# Patient Record
Sex: Female | Born: 1935 | Race: Black or African American | Hispanic: No | Smoking: Never smoker
Health system: Southern US, Community
[De-identification: ages and names within clinical notes are randomized; demographics above are authoritative.]

## PROBLEM LIST (undated history)

## (undated) DIAGNOSIS — I10 Essential (primary) hypertension: Secondary | ICD-10-CM

---

## 2020-10-27 ENCOUNTER — Other Ambulatory Visit: Payer: Self-pay

## 2020-10-27 ENCOUNTER — Emergency Department (HOSPITAL_COMMUNITY): Payer: Medicare Other

## 2020-10-27 ENCOUNTER — Emergency Department (HOSPITAL_COMMUNITY)
Admission: EM | Admit: 2020-10-27 | Discharge: 2020-10-27 | Disposition: A | Discharge status: Home / Self Care | Payer: Medicare Other | Attending: Emergency Medicine | Admitting: Emergency Medicine

## 2020-10-27 DIAGNOSIS — D649 Anemia, unspecified: Secondary | ICD-10-CM

## 2020-10-27 DIAGNOSIS — M1611 Unilateral primary osteoarthritis, right hip: Secondary | ICD-10-CM | POA: Insufficient documentation

## 2020-10-27 DIAGNOSIS — E039 Hypothyroidism, unspecified: Secondary | ICD-10-CM | POA: Diagnosis not present

## 2020-10-27 DIAGNOSIS — Z79899 Other long term (current) drug therapy: Secondary | ICD-10-CM | POA: Insufficient documentation

## 2020-10-27 DIAGNOSIS — W19XXXA Unspecified fall, initial encounter: Secondary | ICD-10-CM | POA: Insufficient documentation

## 2020-10-27 DIAGNOSIS — M25561 Pain in right knee: Secondary | ICD-10-CM | POA: Diagnosis not present

## 2020-10-27 DIAGNOSIS — I10 Essential (primary) hypertension: Secondary | ICD-10-CM | POA: Insufficient documentation

## 2020-10-27 DIAGNOSIS — M545 Low back pain, unspecified: Secondary | ICD-10-CM | POA: Insufficient documentation

## 2020-10-27 DIAGNOSIS — M25562 Pain in left knee: Secondary | ICD-10-CM | POA: Insufficient documentation

## 2020-10-27 DIAGNOSIS — S79911A Unspecified injury of right hip, initial encounter: Secondary | ICD-10-CM | POA: Diagnosis present

## 2020-10-27 DIAGNOSIS — F0390 Unspecified dementia without behavioral disturbance: Secondary | ICD-10-CM | POA: Diagnosis not present

## 2020-10-27 DIAGNOSIS — R531 Weakness: Secondary | ICD-10-CM | POA: Diagnosis not present

## 2020-10-27 DIAGNOSIS — E860 Dehydration: Secondary | ICD-10-CM

## 2020-10-27 DIAGNOSIS — S72114A Nondisplaced fracture of greater trochanter of right femur, initial encounter for closed fracture: Secondary | ICD-10-CM

## 2020-10-27 DIAGNOSIS — G8929 Other chronic pain: Secondary | ICD-10-CM

## 2020-10-27 LAB — COMPREHENSIVE METABOLIC PANEL
ALT: 18 U/L (ref 0–44)
AST: 23 U/L (ref 15–41)
Albumin: 3.1 g/dL — ABNORMAL LOW (ref 3.5–5.0)
Alkaline Phosphatase: 47 U/L (ref 38–126)
Anion gap: 9 (ref 5–15)
BUN: 18 mg/dL (ref 8–23)
CO2: 25 mmol/L (ref 22–32)
Calcium: 7.9 mg/dL — ABNORMAL LOW (ref 8.9–10.3)
Chloride: 108 mmol/L (ref 98–111)
Creatinine, Ser: 1.23 mg/dL — ABNORMAL HIGH (ref 0.44–1.00)
GFR, Estimated: 42 mL/min — ABNORMAL LOW (ref >60)
Glucose, Bld: 116 mg/dL — ABNORMAL HIGH (ref 70–99)
Potassium: 3.2 mmol/L — ABNORMAL LOW (ref 3.5–5.1)
Sodium: 142 mmol/L (ref 135–145)
Total Bilirubin: 0.4 mg/dL (ref 0.3–1.2)
Total Protein: 7 g/dL (ref 6.5–8.1)

## 2020-10-27 LAB — CBC WITH DIFFERENTIAL/PLATELET
Abs Immature Granulocytes: 0.03 10*3/uL (ref 0.00–0.07)
Basophils Absolute: 0 10*3/uL (ref 0.0–0.1)
Basophils Relative: 0 %
Eosinophils Absolute: 0 10*3/uL (ref 0.0–0.5)
Eosinophils Relative: 0 %
HCT: 28.5 % — ABNORMAL LOW (ref 36.0–46.0)
Hemoglobin: 8.2 g/dL — ABNORMAL LOW (ref 12.0–15.0)
Immature Granulocytes: 0 %
Lymphocytes Relative: 18 %
Lymphs Abs: 1.4 10*3/uL (ref 0.7–4.0)
MCH: 23.7 pg — ABNORMAL LOW (ref 26.0–34.0)
MCHC: 28.8 g/dL — ABNORMAL LOW (ref 30.0–36.0)
MCV: 82.4 fL (ref 80.0–100.0)
Monocytes Absolute: 1 10*3/uL (ref 0.1–1.0)
Monocytes Relative: 12 %
Neutro Abs: 5.4 10*3/uL (ref 1.7–7.7)
Neutrophils Relative %: 70 %
Platelets: 221 10*3/uL (ref 150–400)
RBC: 3.46 MIL/uL — ABNORMAL LOW (ref 3.87–5.11)
RDW: 16.4 % — ABNORMAL HIGH (ref 11.5–15.5)
WBC: 7.8 10*3/uL (ref 4.0–10.5)
nRBC: 0 % (ref 0.0–0.2)

## 2020-10-27 LAB — URINALYSIS, ROUTINE W REFLEX MICROSCOPIC
Bilirubin Urine: NEGATIVE
Glucose, UA: NEGATIVE mg/dL
Ketones, ur: NEGATIVE mg/dL
Nitrite: NEGATIVE
Protein, ur: NEGATIVE mg/dL
Specific Gravity, Urine: 1.014 (ref 1.005–1.030)
pH: 7 (ref 5.0–8.0)

## 2020-10-27 LAB — POC OCCULT BLOOD, ED: Fecal Occult Bld: NEGATIVE

## 2020-10-27 MED ORDER — DICLOFENAC SODIUM 1 % EX GEL
2.0000 g | Freq: Once | CUTANEOUS | Status: AC
  Administered 2020-10-27: 2 g via TOPICAL
  Filled 2020-10-27: qty 100

## 2020-10-27 MED ORDER — ACETAMINOPHEN 325 MG PO TABS
650.0000 mg | ORAL_TABLET | Freq: Once | ORAL | Status: AC
  Administered 2020-10-27: 650 mg via ORAL
  Filled 2020-10-27: qty 2

## 2020-10-27 MED ORDER — DOCUSATE SODIUM 100 MG PO CAPS: 100.0000 mg | ORAL_CAPSULE | Freq: Two times a day (BID) | ORAL | 0 refills | Status: AC | PRN

## 2020-10-27 MED ORDER — FERROUS SULFATE 325 (65 FE) MG PO TABS: 325.0000 mg | ORAL_TABLET | Freq: Every day | ORAL | 0 refills | Status: AC

## 2020-10-27 MED ORDER — HYDROCODONE-ACETAMINOPHEN 5-325 MG PO TABS: 1.0000 | ORAL_TABLET | ORAL | 0 refills | Status: AC | PRN

## 2020-10-27 MED ORDER — SODIUM CHLORIDE 0.9 % IV BOLUS
1000.0000 mL | Freq: Once | INTRAVENOUS | Status: AC
  Administered 2020-10-27: 1000 mL via INTRAVENOUS

## 2020-10-27 MED ORDER — FENTANYL CITRATE (PF) 100 MCG/2ML IJ SOLN
25.0000 ug | Freq: Once | INTRAMUSCULAR | Status: AC
  Administered 2020-10-27: 25 ug via INTRAVENOUS
  Filled 2020-10-27: qty 2

## 2020-10-27 NOTE — ED Notes (Addendum)
Unable to obtain blood samples at this time; Provider notified via secure chat.

## 2020-10-27 NOTE — ED Notes (Signed)
Pt placed on purewick 

## 2020-10-27 NOTE — ED Triage Notes (Signed)
Per family and pt; pt fell 2 days ago and has had acute on chronic pain in right hip and knee; pt states that she has missed her last visit for cortisone injections that help with pain. Pt had had increased weakness with increased urine frequency and concentration; per family color of urine is darker.

## 2020-10-27 NOTE — ED Provider Notes (Signed)
[facility]-EMERGENCY DEPT Provider Note   CSN: [id number] Arrival date & time: 10/27/20  1105     History Chief Complaint  Patient presents with  . Weakness   HPI:  Pamela Mora is a 87 y.o. female.  Complaining of bilateral knee pain and low back pain has been ongoing for multiple weeks now.  She is a poor historian given history of dementia although unclear what her mental status baseline is.  States that she has been having difficulty with moving around at home.  She usually uses a 4 wheeled rolling walker for ambulation and states that she has been having a hard time using it to move around her home.  She lives with a friend but usually performs most ADLs on her own.  Upon chart review, she was recently seen by [facility] orthopedics for complaints of bilateral knee pain and low back pain and she intermittently receives any injections for her pain.  Chart review also shows that she has chronic, but stable, weakness bilaterally which is similar to her complaints today.  X-ray imaging during her visit is demonstrative of degenerative changes in the lumbar spine along with L5 compression deformity.  Imaging of her knees post bilateral knee osteoarthritis.  She last received knee injections in August 04, 2020.  She has scheduled follow-up in 1 week with Dr. Bayard Hugger, [facility] orthopedic surgery, for review of DEXA scan and consideration of bilateral knee injections.     Past Medical History:  History of hyperlipidemia, hypertension, hypothyroidism, GERD, anxiety, depression, dementia, iron deficiency anemia  History of closed compression fracture of L5 lumbar vertebrae and chronic pain of both knees.   Medications: Lisinopril 10 mg daily Simvastatin 40 mg daily Levothyroxine 88 mcg daily Sertraline 100 mg daily Risperidone 1.5 mg twice daily Aricept 10 mg daily Pepcid 20 mg Ferrous sulfate 325 mg daily Meloxicam 50 mg daily   Patient  Active Problem List   Diagnosis Date Noted  . Hypothyroidism 10/27/2020  . Hypertension 10/27/2020      OB History   No obstetric history on file.     No family history on file.     Home Medications Prior to Admission medications   Not on File    Allergies    Patient has no known allergies.  Review of Systems   Review of Systems  Constitutional: Negative.   HENT: Negative.   Eyes: Negative.   Respiratory: Negative.   Cardiovascular: Negative.   Gastrointestinal: Negative.   Musculoskeletal: Positive for arthralgias and back pain. Negative for gait problem, joint swelling, myalgias, neck pain and neck stiffness.  Neurological: Negative.   Psychiatric/Behavioral: Negative for agitation and confusion. The patient is not nervous/anxious.     Physical Exam Updated Vital Signs BP (!) 181/63 (BP Location: Right Arm)   Pulse 87   Temp 99.3 F (37.4 C) (Oral)   Resp 18   Ht 5\' 3"  (1.6 m)   Wt 70.3 kg   SpO2 100%   BMI 27.46 kg/m   Physical Exam Constitutional:      Appearance: Normal appearance. She is not ill-appearing.  HENT:     Head: Normocephalic and atraumatic.     Mouth/Throat:     Mouth: Mucous membranes are dry.     Pharynx: Oropharynx is clear. No oropharyngeal exudate.  Eyes:     Extraocular Movements: Extraocular movements intact.     Conjunctiva/sclera: Conjunctivae normal.     Pupils: Pupils are equal, round, and reactive  to light.  Cardiovascular:     Rate and Rhythm: Normal rate and regular rhythm.     Pulses: Normal pulses.     Heart sounds: Normal heart sounds. No murmur heard. No friction rub. No gallop.   Pulmonary:     Effort: Pulmonary effort is normal.     Breath sounds: Normal breath sounds. No wheezing, rhonchi or rales.  Abdominal:     General: Bowel sounds are normal.     Palpations: Abdomen is soft.     Tenderness: There is no abdominal tenderness.  Musculoskeletal:        General: No swelling.     Cervical back: Normal  range of motion.     Comments: Tenderness and impaired ROM of bilateral knee joints. Pinpoint TTP in lower back around area of L4/L5.   Skin:    General: Skin is warm and dry.  Neurological:     General: No focal deficit present.     Mental Status: She is alert. Mental status is at baseline.  Psychiatric:        Mood and Affect: Mood normal.        Behavior: Behavior normal.     ED Results / Procedures / Treatments   Labs (all labs ordered are listed, but only abnormal results are displayed) Labs Reviewed  COMPREHENSIVE METABOLIC PANEL    EKG None  Radiology No results found.  Procedures None  Medications Ordered in ED Medications  diclofenac Sodium (VOLTAREN) 1 % topical gel 2 g (has no administration in time range)  acetaminophen (TYLENOL) tablet 650 mg (650 mg Oral Given 10/27/20 1236)    ED Course  I have reviewed the triage vital signs and the nursing notes.  Pertinent labs & imaging results that were available during my care of the patient were reviewed by me and considered in my medical decision making (see chart for details).    MDM Rules/Calculators/A&P                          Patient here for chronic bilateral knee pain likely from her osteoarthritis and low back pain likely from L5 compression fracture both of which were seen on imaging during visit with [facility] orthopedic surgery about a week ago.  Given chronic pain, will try to treat with Tylenol and Voltaren gel at this time.  Will obtain CMP to ensure no electrolyte abnormalities or worsening kidney function given NSAID therapy for pain.  She has scheduled follow-up with [facility] orthopedic surgery in 1 week for bilateral knee injections and review of DEXA scan so we will avoid administering injections at this time.  Will discharge with Voltaren gel to apply as needed 3 times daily for knee pain and back pain.   Awaiting CMP results at this time.  Dr. Jeraldine Loots will follow up with  results and with dispo planning. Final Clinical Impression(s) / ED Diagnoses Final diagnoses:  None    Rx / DC Orders ED Discharge Orders    None       Merrilyn Puma, MD 10/27/20 1320    Gerhard Munch, MD 10/27/20 1541    Gerhard Munch, MD 10/27/20 1726

## 2020-10-27 NOTE — ED Provider Notes (Signed)
Pt signed out by Dr. Jeraldine Loots pending labs and xrays.  Pt's hemoglobin is 8.2.  She had labs by her new pcp on 3/21.  hgb then was 9.0.  Pt is supposed to be on iron, but her family took her off because it was turning her stool black and causing constipation.  Stool guaiac negative.  Xrays neg, but pt still having pain in her right hip.  Therefore, a CT was done of the hip.      IMPRESSION:  Nondisplaced fracture at the posterolateral corner of the greater  trochanter as described above    Mild overlying soft tissue swelling    Moderate osteoarthritis   Pt is able to ambulate with a walker after fentanyl.  [name] has a wheelchair and walker for pt at home.    Pt is d/c home with lortab.  They know to only give that to her if she really needs it.  Otherwise, use tylenol.  She is also told to take the iron pills and is prescribed colace.  Return if worse.   F/u with pcp and with ortho.   Jacalyn Lefevre, MD 10/27/20 2139

## 2020-11-02 ENCOUNTER — Emergency Department (HOSPITAL_COMMUNITY): Payer: Medicare Other

## 2020-11-02 ENCOUNTER — Encounter (HOSPITAL_COMMUNITY): Payer: Self-pay

## 2020-11-02 ENCOUNTER — Other Ambulatory Visit: Payer: Self-pay

## 2020-11-02 ENCOUNTER — Emergency Department (HOSPITAL_COMMUNITY)
Admission: EM | Admit: 2020-11-02 | Discharge: 2020-11-03 | Disposition: A | Discharge status: Home / Self Care | Payer: Medicare Other | Attending: Emergency Medicine | Admitting: Emergency Medicine

## 2020-11-02 DIAGNOSIS — F0390 Unspecified dementia without behavioral disturbance: Secondary | ICD-10-CM | POA: Insufficient documentation

## 2020-11-02 DIAGNOSIS — Z20822 Contact with and (suspected) exposure to covid-19: Secondary | ICD-10-CM | POA: Diagnosis not present

## 2020-11-02 DIAGNOSIS — S0990XA Unspecified injury of head, initial encounter: Secondary | ICD-10-CM | POA: Diagnosis present

## 2020-11-02 DIAGNOSIS — S3991XA Unspecified injury of abdomen, initial encounter: Secondary | ICD-10-CM | POA: Diagnosis not present

## 2020-11-02 DIAGNOSIS — J939 Pneumothorax, unspecified: Secondary | ICD-10-CM | POA: Insufficient documentation

## 2020-11-02 DIAGNOSIS — R410 Disorientation, unspecified: Secondary | ICD-10-CM | POA: Diagnosis not present

## 2020-11-02 DIAGNOSIS — S299XXA Unspecified injury of thorax, initial encounter: Secondary | ICD-10-CM | POA: Insufficient documentation

## 2020-11-02 DIAGNOSIS — Z741 Need for assistance with personal care: Secondary | ICD-10-CM | POA: Diagnosis not present

## 2020-11-02 DIAGNOSIS — Z79899 Other long term (current) drug therapy: Secondary | ICD-10-CM | POA: Insufficient documentation

## 2020-11-02 DIAGNOSIS — S0083XA Contusion of other part of head, initial encounter: Secondary | ICD-10-CM | POA: Diagnosis not present

## 2020-11-02 DIAGNOSIS — I10 Essential (primary) hypertension: Secondary | ICD-10-CM | POA: Diagnosis not present

## 2020-11-02 DIAGNOSIS — W19XXXA Unspecified fall, initial encounter: Secondary | ICD-10-CM | POA: Diagnosis not present

## 2020-11-02 DIAGNOSIS — E039 Hypothyroidism, unspecified: Secondary | ICD-10-CM | POA: Insufficient documentation

## 2020-11-02 HISTORY — DX: Essential (primary) hypertension: I10

## 2020-11-02 LAB — CBC WITH DIFFERENTIAL/PLATELET
Abs Immature Granulocytes: 0.01 10*3/uL (ref 0.00–0.07)
Basophils Absolute: 0 10*3/uL (ref 0.0–0.1)
Basophils Relative: 0 %
Eosinophils Absolute: 0.1 10*3/uL (ref 0.0–0.5)
Eosinophils Relative: 1 %
HCT: 32 % — ABNORMAL LOW (ref 36.0–46.0)
Hemoglobin: 9.1 g/dL — ABNORMAL LOW (ref 12.0–15.0)
Immature Granulocytes: 0 %
Lymphocytes Relative: 14 %
Lymphs Abs: 1.1 10*3/uL (ref 0.7–4.0)
MCH: 23.9 pg — ABNORMAL LOW (ref 26.0–34.0)
MCHC: 28.4 g/dL — ABNORMAL LOW (ref 30.0–36.0)
MCV: 84 fL (ref 80.0–100.0)
Monocytes Absolute: 0.6 10*3/uL (ref 0.1–1.0)
Monocytes Relative: 8 %
Neutro Abs: 5.6 10*3/uL (ref 1.7–7.7)
Neutrophils Relative %: 77 %
Platelets: 253 10*3/uL (ref 150–400)
RBC: 3.81 MIL/uL — ABNORMAL LOW (ref 3.87–5.11)
RDW: 16.4 % — ABNORMAL HIGH (ref 11.5–15.5)
WBC: 7.4 10*3/uL (ref 4.0–10.5)
nRBC: 0 % (ref 0.0–0.2)

## 2020-11-02 LAB — BASIC METABOLIC PANEL
Anion gap: 9 (ref 5–15)
BUN: 17 mg/dL (ref 8–23)
CO2: 23 mmol/L (ref 22–32)
Calcium: 7.8 mg/dL — ABNORMAL LOW (ref 8.9–10.3)
Chloride: 107 mmol/L (ref 98–111)
Creatinine, Ser: 1.1 mg/dL — ABNORMAL HIGH (ref 0.44–1.00)
GFR, Estimated: 48 mL/min — ABNORMAL LOW (ref >60)
Glucose, Bld: 101 mg/dL — ABNORMAL HIGH (ref 70–99)
Potassium: 3.4 mmol/L — ABNORMAL LOW (ref 3.5–5.1)
Sodium: 139 mmol/L (ref 135–145)

## 2020-11-02 LAB — RESP PANEL BY RT-PCR (FLU A&B, COVID) ARPGX2
Influenza A by PCR: NEGATIVE
Influenza B by PCR: NEGATIVE
SARS Coronavirus 2 by RT PCR: NEGATIVE

## 2020-11-02 LAB — CBG MONITORING, ED: Glucose-Capillary: 119 mg/dL — ABNORMAL HIGH (ref 70–99)

## 2020-11-02 MED ORDER — RISPERIDONE 0.5 MG PO TABS
0.5000 mg | ORAL_TABLET | Freq: Every day | ORAL | Status: DC
  Administered 2020-11-03: 0.5 mg via ORAL
  Filled 2020-11-02: qty 1

## 2020-11-02 MED ORDER — IOHEXOL 300 MG/ML  SOLN
100.0000 mL | Freq: Once | INTRAMUSCULAR | Status: AC | PRN
  Administered 2020-11-02: 100 mL via INTRAVENOUS

## 2020-11-02 MED ORDER — LISINOPRIL 10 MG PO TABS
10.0000 mg | ORAL_TABLET | Freq: Once | ORAL | Status: AC
  Administered 2020-11-03: 10 mg via ORAL
  Filled 2020-11-02: qty 1

## 2020-11-02 MED ORDER — LEVOTHYROXINE SODIUM 88 MCG PO TABS
88.0000 ug | ORAL_TABLET | Freq: Every morning | ORAL | Status: DC
  Filled 2020-11-02: qty 1

## 2020-11-02 MED ORDER — ACETAMINOPHEN 500 MG PO TABS
1000.0000 mg | ORAL_TABLET | Freq: Once | ORAL | Status: AC
  Administered 2020-11-02: 1000 mg via ORAL
  Filled 2020-11-02: qty 2

## 2020-11-02 MED ORDER — FAMOTIDINE 20 MG PO TABS
20.0000 mg | ORAL_TABLET | Freq: Two times a day (BID) | ORAL | Status: DC
  Administered 2020-11-03 (×2): 20 mg via ORAL
  Filled 2020-11-02 (×2): qty 1

## 2020-11-02 MED ORDER — SERTRALINE HCL 50 MG PO TABS
100.0000 mg | ORAL_TABLET | Freq: Every day | ORAL | Status: DC
  Administered 2020-11-03: 100 mg via ORAL
  Filled 2020-11-02: qty 2

## 2020-11-02 MED ORDER — FERROUS SULFATE 325 (65 FE) MG PO TABS
325.0000 mg | ORAL_TABLET | Freq: Every day | ORAL | Status: DC
  Administered 2020-11-02 – 2020-11-03 (×2): 325 mg via ORAL
  Filled 2020-11-02 (×2): qty 1

## 2020-11-02 MED ORDER — LISINOPRIL 10 MG PO TABS
10.0000 mg | ORAL_TABLET | Freq: Every day | ORAL | Status: DC
  Administered 2020-11-03: 10 mg via ORAL
  Filled 2020-11-02: qty 1

## 2020-11-02 NOTE — ED Notes (Signed)
Pt hypertensive, NAD noted.  MD made aware, MD resumed home meds which includes lisinopril.  Will administer.

## 2020-11-02 NOTE — ED Notes (Addendum)
Pt stood up beside bed and shuffled her feet twice and was fully relying on 2 techs for support. Pt stats her legs were very weak and wobbly.

## 2020-11-02 NOTE — ED Notes (Addendum)
Pt given graham crackers, applesauce, and sprite per request.  No diet or npo order in place, pt states she has not had anything to eat today and has been here since 8am.

## 2020-11-02 NOTE — Evaluation (Signed)
Physical Therapy Evaluation Patient Details Name: Pamela Mora MRN: [id number] DOB: Apr 12, 1929 Today's Date: 11/02/2020   History of Present Illness  87 yo F presents due to fall a couple days ago. PMH: HTN  Clinical Impression  Pt admitted with above diagnosis. Pt oriented to name only, chart review from previous admission states pt lives with friend, independent using 4 wheeled walker for ambulation and ADLs. Pt states she uses SPC and denies falls ever, though pt is in hospital due to recent fall. Pt denies pain, requiring mod A to sit up at EOB for trunk uprighting and min A to lift BLE back into bed. Pt reports dizziness upon sitting EOB, BP noted to be 203/74, returned to supine without improvement in symptoms, found to be 205/59mmHg- nurse tech in room verbalized she will tell RN. Transfers and ambulation not attempted due to BP not in therapeutic range and pt symptomatic. Pt will requires 24 hr assist at home, unsure if that is baseline or if family is available to assist; recommending SNF and will continue to assess DME needs. Pt currently with functional limitations due to the deficits listed below (see PT Problem List). Pt will benefit from skilled PT to increase their independence and safety with mobility to allow discharge to the venue listed below.       Follow Up Recommendations SNF    Equipment Recommendations  Other (comment) (TBD pending home equipment)    Recommendations for Other Services       Precautions / Restrictions        Mobility  Bed Mobility Overal bed mobility: Needs Assistance Bed Mobility: Supine to Sit;Sit to Supine  Supine to sit: Mod assist Sit to supine: Min assist   General bed mobility comments: mod A to upright trunk into sitting and pt able to mobilize BLE over to EOB, min A to lift BLE back into bed, increased time and multimodal cues    Transfers  General transfer comment: not attempted, pt reports dizziness upon sitting and BP  203/90mmHg  Ambulation/Gait  General Gait Details: not attempted, pt reports dizziness upon sitting and BP 203/31mmHg  Stairs            Wheelchair Mobility    Modified Rankin (Stroke Patients Only)       Balance Overall balance assessment: Needs assistance;History of Falls Sitting-balance support: Bilateral upper extremity supported Sitting balance-Leahy Scale: Fair Sitting balance - Comments: seated EOB          Pertinent Vitals/Pain Pain Assessment: No/denies pain    Home Living Family/patient expects to be discharged to:: Unsure Living Arrangements: Other relatives  Additional Comments: Per chart review, previous admission states pt living with friend, pt currently only oriented to name and no family present    Prior Function   Comments: Per chart review, previous admission states pt using 4 wheeled walked and independent with ADLs, pt currently states she uses SPC and denies falls ever, but present of fall; no family present     Hand Dominance        Extremity/Trunk Assessment   Upper Extremity Assessment Upper Extremity Assessment: Generalized weakness    Lower Extremity Assessment Lower Extremity Assessment: Generalized weakness    Cervical / Trunk Assessment Cervical / Trunk Assessment: Kyphotic  Communication   Communication: No difficulties  Cognition Arousal/Alertness: Awake/alert Behavior During Therapy: WFL for tasks assessed/performed Overall Cognitive Status: No family/caregiver present to determine baseline cognitive functioning  General Comments: Pt oriented to name only, states age is "[age]" and location "I used to live here". Pt requires increased time and multimodal cues for follow commands.      General Comments      Exercises     Assessment/Plan    PT Assessment Patient needs continued PT services  PT Problem List Decreased strength;Decreased activity tolerance;Decreased balance;Decreased mobility;Decreased  cognition;Decreased knowledge of use of DME       PT Treatment Interventions DME instruction;Gait training;Functional mobility training;Therapeutic activities;Therapeutic exercise;Balance training;Patient/family education    PT Goals (Current goals can be found in the Care Plan section)  Acute Rehab PT Goals Patient Stated Goal: "I'm cold" PT Goal Formulation: With patient Time For Goal Achievement: 11/16/20 Potential to Achieve Goals: Fair    Frequency Min 2X/week   Barriers to discharge        Co-evaluation               AM-PAC PT "6 Clicks" Mobility  Outcome Measure Help needed turning from your back to your side while in a flat bed without using bedrails?: A Lot Help needed moving from lying on your back to sitting on the side of a flat bed without using bedrails?: A Lot Help needed moving to and from a bed to a chair (including a wheelchair)?: A Lot Help needed standing up from a chair using your arms (e.g., wheelchair or bedside chair)?: A Lot Help needed to walk in hospital room?: Total Help needed climbing 3-5 steps with a railing? : Total 6 Click Score: 10    End of Session   Activity Tolerance: Treatment limited secondary to medical complications (Comment) Patient left: in bed;with call bell/phone within reach;with bed alarm set Nurse Communication: Mobility status;Other (comment) (BP) PT Visit Diagnosis: Other abnormalities of gait and mobility (R26.89);Muscle weakness (generalized) (M62.81);History of falling (Z91.81)    Time: 7628-3151 PT Time Calculation (min) (ACUTE ONLY): 16 min   Charges:   PT Evaluation $PT Eval Low Complexity: 1 Low           Tori Mivaan Corbitt PT, DPT 11/02/20, 3:46 PM

## 2020-11-02 NOTE — ED Triage Notes (Signed)
Pt fell 2 days ago- c/o gen pain all over. Had appt w PCP d/t recent falls, but unable to get up d/t pain. Has not had pain med since 12pm yesterday. Pt has hx of dementia

## 2020-11-02 NOTE — ED Notes (Signed)
Pt placed on purewick 

## 2020-11-02 NOTE — Progress Notes (Signed)
..   Transition of Care [facility]) - Emergency Department Mini Assessment   Patient Details  Name: Pamela Mora MRN: [id number] Date of Birth: 02-Jul-1929  Transition of [facility]) CM/SW Contact:    [name], RN Phone Number: [phone] 11/02/2020, 6:39 PM   Clinical Narrative: TOC CM spoke to grandson, [employer] and states he moved pt here to live with him approximately 6 weeks ago. States he has be caring for her in the home as he runs his own business. States she need rehab to get a little stronger before she comes back to his home. States he has applied for her Medicaid in [place]. She had [employer] (Medicaid) in [place]. Gave permission to create FL2 and fax referral for SNF rehab. 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    ED Mini Assessment: What brought you to the Emergency Department? : falls  Barriers to Discharge: SNF Pending bed offer  Barrier interventions: faxed referral to SNF  Means of departure: Ambulance  Interventions which prevented an admission or readmission: SNF Placement    Patient Contact and Communications Key Contact 1: [name]   Spoke with: grandson Contact Date: 11/02/20,   Contact time: 1837 Contact Phone Number: [phone] Call outcome: gave permission to create FL2 and fax referral for SNF  Patient states their goals for this hospitalization and ongoing recovery are:: to go to rehab CMS Medicare.gov Compare Post Acute Care list provided to:: Patient Represenative (must comment) Susy Frizzle) Choice offered to / list presented to : Adult Children  Admission diagnosis:  fall;genralized pain Patient Active Problem List   Diagnosis Date Noted  . Hypothyroidism 10/27/2020  . Hypertension 10/27/2020   PCP:  Pcp, No Pharmacy:   [id number] [place], [place] - [address] [place] [address] Phone: [phone] Fax: [phone]

## 2020-11-02 NOTE — ED Notes (Signed)
PT at bedside to evaluate pt.

## 2020-11-02 NOTE — ED Notes (Signed)
Pt stats she feels very weak and is not able to try and walk.

## 2020-11-02 NOTE — [place] FL2 (Addendum)
  [place] MEDICAID FL2 LEVEL OF CARE SCREENING TOOL     IDENTIFICATION  Patient Name: Pamela Mora Birthdate: 05/29/1929 Sex: female Admission Date (Current Location): 11/02/2020  [facility] [place] Number:  Producer, television/film/video and Address:  [facility],  [address], [place] [address]      Provider Number: [id number]  Attending Physician Name and Address:  Default, Provider, MD  Relative Name and Phone Number:  Pamela Mora #[phone]    Current Level of Care: Hospital Recommended Level of Care: Skilled Nursing Facility Prior Approval Number:    Date Approved/Denied:   PASRR Number: [id number]  Discharge Plan: SNF    Current Diagnoses: Patient Active Problem List   Diagnosis Date Noted  . Hypothyroidism 10/27/2020  . Hypertension 10/27/2020    Orientation RESPIRATION BLADDER Height & Weight     Self,Time,Situation  Normal Continent Weight: 70 kg Height:  5\' 3"  (160 cm)  BEHAVIORAL SYMPTOMS/MOOD NEUROLOGICAL BOWEL NUTRITION STATUS      Continent Diet (regular)  AMBULATORY STATUS COMMUNICATION OF NEEDS Skin   Limited Assist Verbally Normal                       Personal Care Assistance Level of Assistance  Bathing,Feeding,Dressing Bathing Assistance: Limited assistance Feeding assistance: Limited assistance Dressing Assistance: Maximum assistance     Functional Limitations Info  Sight,Hearing,Speech Sight Info: Impaired Hearing Info: Adequate Speech Info: Adequate    SPECIAL CARE FACTORS FREQUENCY  PT (By licensed PT),OT (By licensed OT)     PT Frequency: 5x per week OT Frequency: 5x per week            Contractures Contractures Info: Not present    Additional Factors Info  Code Status,Allergies Code Status Info: Full Code Allergies Info: NKA           Current Medications (11/02/2020):  This is the current hospital active medication list Current Facility-Administered Medications  Medication Dose Route  Frequency Provider Last Rate Last Admin  . ferrous sulfate tablet 325 mg  325 mg Oral Daily 01/02/2021, DO   325 mg at 11/02/20 1632   Current Outpatient Medications  Medication Sig Dispense Refill  . docusate sodium (COLACE) 100 MG capsule Take 1 capsule (100 mg total) by mouth 2 (two) times daily as needed for mild constipation. 60 capsule 0  . ferrous sulfate 325 (65 FE) MG tablet Take 1 tablet (325 mg total) by mouth daily. 30 tablet 0  . HYDROcodone-acetaminophen (NORCO/VICODIN) 5-325 MG tablet Take 1 tablet by mouth every 4 (four) hours as needed. 10 tablet 0     Discharge Medications: Please see discharge summary for a list of discharge medications.  Relevant Imaging Results:  Relevant Lab Results:   Additional Information SS # [id number] vaccinated and booster received  Pamela Mora, [id number], RN

## 2020-11-02 NOTE — ED Provider Notes (Signed)
[facility]-EMERGENCY DEPT Provider Note   CSN: [id number] Arrival date & time: 11/02/20  6063     History Chief Complaint  Patient presents with  . Fall    Pamela Mora is a 87 y.o. female.  87 yo F with a chief complaint of a fall a couple days ago.  She was set to follow-up with her family doctor today but was too uncomfortable to stand up per the family and so was sent back to the ED for evaluation.  Patient has a history of dementia and has trouble providing much history.  Tells me that she feels weak but has trouble explaining any further.  She denies any pain currently.  Level 5 caveat dementia.  The history is provided by the patient and the EMS personnel.  Fall  Illness      Past Medical History:  Diagnosis Date  . Hypertension     Patient Active Problem List   Diagnosis Date Noted  . Hypothyroidism 10/27/2020  . Hypertension 10/27/2020    History reviewed. No pertinent surgical history.   OB History   No obstetric history on file.     No family history on file.  Social History   Tobacco Use  . Smoking status: Never Smoker  . Smokeless tobacco: Never Used  Substance Use Topics  . Alcohol use: Not Currently  . Drug use: Not Currently    Home Medications Prior to Admission medications   Medication Sig Start Date End Date Taking? Authorizing Provider  docusate sodium (COLACE) 100 MG capsule Take 1 capsule (100 mg total) by mouth 2 (two) times daily as needed for mild constipation. 10/27/20  Yes Jacalyn Lefevre, MD  donepezil (ARICEPT) 10 MG tablet Take 10 mg by mouth at bedtime. 10/14/20 01/12/21 Yes [provider]  famotidine (PEPCID) 20 MG tablet Take 20 mg by mouth in the morning and at bedtime. 10/14/20 01/12/21 Yes [provider]  ferrous sulfate 325 (65 FE) MG tablet Take 1 tablet (325 mg total) by mouth daily. 10/27/20  Yes Jacalyn Lefevre, MD  levothyroxine (SYNTHROID) 88 MCG tablet Take 88 mcg by mouth in the  morning. 10/14/20 01/12/21 Yes [provider]  lisinopril (ZESTRIL) 10 MG tablet Take 10 mg by mouth daily. 10/19/20 11/18/20 Yes [provider]  risperiDONE (RISPERDAL) 0.5 MG tablet Take 0.5 mg by mouth in the morning and at bedtime. 10/21/20 01/19/21 Yes [provider]  sertraline (ZOLOFT) 100 MG tablet Take 1 tablet by mouth daily. 10/14/20 01/12/21 Yes [provider]  simvastatin (ZOCOR) 40 MG tablet Take 40 mg by mouth at bedtime. 10/19/20  Yes [provider]  HYDROcodone-acetaminophen (NORCO/VICODIN) 5-325 MG tablet Take 1 tablet by mouth every 4 (four) hours as needed. Patient not taking: Reported on 11/02/2020 10/27/20   Jacalyn Lefevre, MD    Allergies    Patient has no known allergies.  Review of Systems   Review of Systems  Unable to perform ROS: Dementia    Physical Exam Updated Vital Signs BP (!) 178/69   Pulse 79   Temp 98.3 F (36.8 C) (Oral)   Resp 20   Ht 5\' 3"  (1.6 m)   Wt 70 kg   SpO2 99%   BMI 27.34 kg/m   Physical Exam Vitals and nursing note reviewed.  Constitutional:      General: She is not in acute distress.    Appearance: She is well-developed. She is not diaphoretic.  HENT:     Head: Normocephalic  and atraumatic.  Eyes:     Pupils: Pupils are equal, round, and reactive to light.  Cardiovascular:     Rate and Rhythm: Normal rate and regular rhythm.     Heart sounds: No murmur heard. No friction rub. No gallop.   Pulmonary:     Effort: Pulmonary effort is normal.     Breath sounds: No wheezing or rales.  Abdominal:     General: There is no distension.     Palpations: Abdomen is soft.     Tenderness: There is no abdominal tenderness.  Musculoskeletal:        General: No tenderness.     Cervical back: Normal range of motion and neck supple.     Comments: No pain with internal and external rotation of the legs bilaterally.  No appreciable bony tenderness on palpation from head to toe.  Skin:     General: Skin is warm and dry.  Neurological:     Mental Status: She is alert. She is disoriented.  Psychiatric:        Behavior: Behavior normal.     ED Results / Procedures / Treatments   Labs (all labs ordered are listed, but only abnormal results are displayed) Labs Reviewed  CBC WITH DIFFERENTIAL/PLATELET - Abnormal; Notable for the following components:      Result Value   RBC 3.81 (*)    Hemoglobin 9.1 (*)    HCT 32.0 (*)    MCH 23.9 (*)    MCHC 28.4 (*)    RDW 16.4 (*)    All other components within normal limits  BASIC METABOLIC PANEL - Abnormal; Notable for the following components:   Potassium 3.4 (*)    Glucose, Bld 101 (*)    Creatinine, Ser 1.10 (*)    Calcium 7.8 (*)    GFR, Estimated 48 (*)    All other components within normal limits  CBC - Abnormal; Notable for the following components:   RBC 3.34 (*)    Hemoglobin 8.1 (*)    HCT 26.5 (*)    MCV 79.3 (*)    MCH 24.3 (*)    RDW 16.1 (*)    All other components within normal limits  CBG MONITORING, ED - Abnormal; Notable for the following components:   Glucose-Capillary 119 (*)    All other components within normal limits  RESP PANEL BY RT-PCR (FLU A&B, COVID) [id number]    EKG None  Radiology CT Head Wo Contrast  Result Date: 11/02/2020 CLINICAL DATA:  Fall 2 days ago with headaches and neck strain, initial encounter EXAM: CT HEAD WITHOUT CONTRAST CT CERVICAL SPINE WITHOUT CONTRAST TECHNIQUE: Multidetector CT imaging of the head and cervical spine was performed following the Mora protocol without intravenous contrast. Multiplanar CT image reconstructions of the cervical spine were also generated. COMPARISON:  None. FINDINGS: CT HEAD FINDINGS Brain: Generalized atrophic changes and chronic white matter ischemic changes are seen. No findings to suggest acute hemorrhage, acute infarction or space-occupying mass lesion are noted. 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  Skull: Changes consistent with prior left craniotomy are noted. No other bony abnormality is seen. Sinuses/Orbits: No acute finding. Other: None. CT CERVICAL SPINE FINDINGS Alignment: Within normal limits. Skull base and vertebrae: 7 cervical segments are well visualized. Vertebral body height is well maintained. Multilevel osteophytic changes and facet hypertrophic changes are noted. No acute fracture or acute facet abnormality is noted. Soft tissues and spinal canal: Surrounding soft tissue structures are  within normal limits. Upper chest: Visualized lung apices are within normal limits. Other: None IMPRESSION: CT of the head: Chronic atrophic and ischemic changes without acute abnormality. 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 CT of the cervical spine: Multilevel degenerative changes are noted without acute abnormality. Electronically Signed   By: Alcide Clever M.D.   On: 11/02/2020 11:46   CT Cervical Spine Wo Contrast  Result Date: 11/02/2020 CLINICAL DATA:  Fall 2 days ago with headaches and neck strain, initial encounter EXAM: CT HEAD WITHOUT CONTRAST CT CERVICAL SPINE WITHOUT CONTRAST TECHNIQUE: Multidetector CT imaging of the head and cervical spine was performed following the Mora protocol without intravenous contrast. Multiplanar CT image reconstructions of the cervical spine were also generated. COMPARISON:  None. FINDINGS: CT HEAD FINDINGS Brain: Generalized atrophic changes and chronic white matter ischemic changes are seen. No findings to suggest acute hemorrhage, acute infarction or space-occupying mass lesion are noted. 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 Skull: Changes consistent with prior left craniotomy are noted. No other bony abnormality is seen. Sinuses/Orbits: No acute finding. Other: None. CT CERVICAL SPINE FINDINGS Alignment: Within normal limits. Skull base and vertebrae: 7 cervical segments are well visualized. Vertebral body height  is well maintained. Multilevel osteophytic changes and facet hypertrophic changes are noted. No acute fracture or acute facet abnormality is noted. Soft tissues and spinal canal: Surrounding soft tissue structures are within normal limits. Upper chest: Visualized lung apices are within normal limits. Other: None IMPRESSION: CT of the head: Chronic atrophic and ischemic changes without acute abnormality. 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 CT of the cervical spine: Multilevel degenerative changes are noted without acute abnormality. Electronically Signed   By: Alcide Clever M.D.   On: 11/02/2020 11:46   CT CHEST ABDOMEN PELVIS W CONTRAST  Result Date: 11/02/2020 CLINICAL DATA:  Recent fall with pain, initial encounter EXAM: CT CHEST, ABDOMEN, AND PELVIS WITH CONTRAST TECHNIQUE: Multidetector CT imaging of the chest, abdomen and pelvis was performed following the Mora protocol during bolus administration of intravenous contrast. CONTRAST:  OMNIPAQUE IOHEXOL 300 MG/ML  SOLN COMPARISON:  Plain film of the chest from earlier in the same day. FINDINGS: CT CHEST FINDINGS Cardiovascular: Heart is at the upper limits of normal in size. Coronary calcifications are noted. Atherosclerotic calcifications of the aorta are noted without aneurysmal dilatation. No dissection is noted. No large central pulmonary embolus is seen. Mediastinum/Nodes: Thoracic inlet is within normal limits. No hilar or mediastinal adenopathy is noted. The esophagus as visualized is within normal limits with the exception of a small sliding-type hiatal hernia. 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 Lungs/Pleura: Lungs are well aerated bilaterally. Previously seen pneumothorax on chest x-ray is not well appreciated on today's CT and may have been artifactual in nature. Mild dependent atelectatic changes are  noted bilaterally. No focal confluent infiltrate or contusion is noted. Musculoskeletal: Degenerative changes of the thoracic spine are noted. No acute bony abnormality is noted. CT ABDOMEN PELVIS FINDINGS Hepatobiliary: Gallbladder has been surgically removed. Liver appears within normal limits. Pancreas: Unremarkable. No pancreatic ductal dilatation or surrounding inflammatory changes. Spleen: Mild perisplenic fluid is noted likely related to the recent injury. A tiny area of decreased attenuation is noted best visualized on the coronal image number 94 of series 7. This likely represents a grade 1 splenic laceration.  The remainder of the spleen appears within normal limits. Adrenals/Urinary Tract: Adrenal glands are within normal limits. Kidneys demonstrate a normal enhancement pattern. Delayed images demonstrate normal excretion. No obstructive changes are seen. The bladder is decompressed. Stomach/Bowel: Diverticular change of the colon is noted without evidence of diverticulitis. No obstructive or inflammatory changes of the colon are seen. The appendix is within normal limits. No small bowel or gastric abnormality is noted aside from the small sliding-type hiatal hernia. Vascular/Lymphatic: Aortic atherosclerosis. No enlarged abdominal or pelvic lymph nodes. Reproductive: Status post hysterectomy. No adnexal masses. Other: Minimal free fluid in the pelvis is noted. Musculoskeletal: No acute bony abnormality is noted. IMPRESSION: CT of the chest: No evidence of pneumothorax is noted. Findings on recent chest x-ray a were likely artifactual in nature. Ovoid fluid collection along the right heart border extending to the subcarinal space consistent with a duplication cyst. Mild dependent atelectatic changes. CT of the abdomen and pelvis: Grade 1 splenic laceration with a small amount of perisplenic fluid. No active extravasation is identified at this time. Hiatal hernia. Diverticulosis without diverticulitis.  Critical Value/emergent results were called by telephone at the time of interpretation on 11/02/2020 at 12:03 pm to Dr. Melene Plan , who verbally acknowledged these results. Electronically Signed   By: Alcide Clever M.D.   On: 11/02/2020 12:06   DG Chest Port 1 View  Result Date: 11/02/2020 CLINICAL DATA:  Chest pain following fall EXAM: PORTABLE CHEST 1 VIEW COMPARISON:  None. FINDINGS: There is a small right apical pneumothorax without tension component. Lungs are clear. Heart size and pulmonary vascularity are normal. No adenopathy. There is aortic atherosclerosis. No fracture appreciable. IMPRESSION: Small right apical pneumothorax without tension component. No edema or airspace opacity. Heart size normal. No fracture demonstrable on this study. Aortic Atherosclerosis (ICD10-I70.0). Critical Value/emergent results were called by telephone at the time of interpretation on 11/02/2020 at 10:31 am to provider Pamela Mora , who verbally acknowledged these results. Electronically Signed   By: Bretta Bang III M.D.   On: 11/02/2020 10:32    Procedures Procedures   Medications Ordered in ED Medications  ferrous sulfate tablet 325 mg (325 mg Oral Given 11/03/20 1409)  famotidine (PEPCID) tablet 20 mg (20 mg Oral Given 11/03/20 1409)  levothyroxine (SYNTHROID) tablet 88 mcg (88 mcg Oral Not Given 11/03/20 0637)  lisinopril (ZESTRIL) tablet 10 mg (10 mg Oral Given 11/03/20 1409)  risperiDONE (RISPERDAL) tablet 0.5 mg (0.5 mg Oral Given 11/03/20 0009)  sertraline (ZOLOFT) tablet 100 mg (100 mg Oral Given 11/03/20 1410)  acetaminophen (TYLENOL) tablet 1,000 mg (1,000 mg Oral Given 11/02/20 0916)  iohexol (OMNIPAQUE) 300 MG/ML solution 100 mL (100 mLs Intravenous Contrast Given 11/02/20 1123)  lisinopril (ZESTRIL) tablet 10 mg (10 mg Oral Given 11/03/20 0009)    ED Course  I have reviewed the triage vital signs and the nursing notes.  Pertinent labs & imaging results that were available during my care of the  patient were reviewed by me and considered in my medical decision making (see chart for details).    MDM Rules/Calculators/A&P                          87 yo F with a chief complaints of a fall.  This occurred a couple days ago.  Patient was set to follow-up with her family doctor today but this was too weaker and too much pain to stand and so was sent to the ED  for evaluation.  I reviewed the patient's visits from a couple days ago she had an extensive imaging evaluation including a CT scan of her hip.  There is some concern for acute anemia with a hemoglobin of 8.2 down from 9 in the office earlier in the week.  We will recheck CBC attempt to ambulate here.  Able to stand a take a couple steps.  Blood work without significant anemia.    Renal function mildly improved from a couple days ago.   I discussed the case with the patient's grandson who appears to be the primary caregiver.  States that she recently has moved and is getting used to her new surroundings.  Unfortunately went from being completely independent requiring 24-hour care over the past couple weeks.  Had a fall about a week ago without any obvious injury and then fell a couple days ago and was brought to the emergency department.  She was unable to get up to go to her doctor's appointment today and they were going with the idea that she likely needs to go into a short-term rehab facility.  We will consult case management and social work to try and evaluate for short-term rehab versus home health.  I received a call from the radiologist with concern for possible pneumothorax on the right.  Will obtain trauma scans.  Trauma scans without ptx, grade 1 splenic lac.  Will discuss with trauma.  I discussed the case with Pamela Mora from trauma surgery.  As the patient had an injury greater than 24 hours ago and is stable hemoglobin did not feel that she would warrant continued hospital observation for her splenic laceration.   The patients  results and plan were reviewed and discussed.   Any x-rays performed were independently reviewed by myself.   Differential diagnosis were considered with the presenting HPI.  Medications  ferrous sulfate tablet 325 mg (325 mg Oral Given 11/03/20 1409)  famotidine (PEPCID) tablet 20 mg (20 mg Oral Given 11/03/20 1409)  levothyroxine (SYNTHROID) tablet 88 mcg (88 mcg Oral Not Given 11/03/20 0637)  lisinopril (ZESTRIL) tablet 10 mg (10 mg Oral Given 11/03/20 1409)  risperiDONE (RISPERDAL) tablet 0.5 mg (0.5 mg Oral Given 11/03/20 0009)  sertraline (ZOLOFT) tablet 100 mg (100 mg Oral Given 11/03/20 1410)  acetaminophen (TYLENOL) tablet 1,000 mg (1,000 mg Oral Given 11/02/20 0916)  iohexol (OMNIPAQUE) 300 MG/ML solution 100 mL (100 mLs Intravenous Contrast Given 11/02/20 1123)  lisinopril (ZESTRIL) tablet 10 mg (10 mg Oral Given 11/03/20 0009)    Vitals:   11/03/20 1330 11/03/20 1451 11/03/20 1500 11/03/20 1545  BP: (!) 166/71 (!) 175/65 (!) 173/72 (!) 178/69  Pulse: 98 86 77 79  Resp: 19 18 19 20   Temp:      TempSrc:      SpO2: 100% 100% 99% 99%  Weight:      Height:        Final diagnoses:    Fall, initial encounter  Contusion of face, initial encounter  Requires assistance with activities of daily living (ADL)      Final Clinical Impression(s) / ED Diagnoses Final diagnoses:  Pneumothorax  Fall, initial encounter  Contusion of face, initial encounter  Requires assistance with activities of daily living (ADL)    Rx / DC Orders ED Discharge Orders    None       Melene PlanFloyd, Rozanne Heumann, DO 11/03/20 1608

## 2020-11-02 NOTE — ED Notes (Signed)
Pt is hypertensive, provider made aware

## 2020-11-02 NOTE — ED Notes (Signed)
Pt denies any pain.

## 2020-11-03 ENCOUNTER — Encounter (HOSPITAL_COMMUNITY): Payer: Self-pay | Admitting: Emergency Medicine

## 2020-11-03 LAB — CBC
HCT: 26.5 % — ABNORMAL LOW (ref 36.0–46.0)
Hemoglobin: 8.1 g/dL — ABNORMAL LOW (ref 12.0–15.0)
MCH: 24.3 pg — ABNORMAL LOW (ref 26.0–34.0)
MCHC: 30.6 g/dL (ref 30.0–36.0)
MCV: 79.3 fL — ABNORMAL LOW (ref 80.0–100.0)
Platelets: 260 10*3/uL (ref 150–400)
RBC: 3.34 MIL/uL — ABNORMAL LOW (ref 3.87–5.11)
RDW: 16.1 % — ABNORMAL HIGH (ref 11.5–15.5)
WBC: 6.5 10*3/uL (ref 4.0–10.5)
nRBC: 0 % (ref 0.0–0.2)

## 2020-11-03 NOTE — ED Notes (Addendum)
Patient was given a sandwich, a cup of water, and a cup of coffee.

## 2020-11-03 NOTE — Progress Notes (Signed)
TOC CSW spoke with Rashema/Maple Lucas Mallow about pt.  Rashema will be reviewing pt.  CSW will continue to follow for dc needs.  Jalene Lacko Tarpley-Carter, MSW, LCSW-A Pronouns:  She, Her, Hers                  [facility] ED Transitions of CareClinical Social Worker [email or web address] [phone]

## 2020-11-03 NOTE — Progress Notes (Signed)
. .   Transition of Care [facility]) - Emergency Department Mini Assessment   Patient Details  Name: Pamela Mora MRN: [id number] Date of Birth: 04/16/1929  Transition of Care [facility]) CM/SW Contact:    [name], RN Phone Number: [phone] 11/03/2020, 3:34 PM   Clinical Narrative: Provided grandson with a list of SNF choice that accepted. TOC CM spoke to grandson and pick [employer]. Spoke to [employer], rep [employer] and they cannot accept Medicare without a 3 IP qualifying stay. Received call from [facility], rep Tresa Endo and they accepted. [name] has accepted [facility]. Call report [phone] room 102 to [facility] given to ED RN. Pt has been COVID vaccinated and boosted. Son is trying to locate documentation.      ED Mini Assessment: What brought you to the Emergency Department? : falls  Barriers to Discharge: No Barriers Identified  Barrier interventions: faxed referral to SNF  Means of departure: Ambulance  Interventions which prevented an admission or readmission: SNF Placement    Patient Contact and Communications Key Contact 1: [name]   Spoke with: grandson Contact Date: 11/03/20,   Contact time: 1531 Contact Phone Number: [phone] Call outcome: grandson received choice list for Medicare, picked out [facility]  Patient states their goals for this hospitalization and ongoing recovery are:: to go to rehab CMS Medicare.gov Compare Post Acute Care list provided to:: Patient Represenative (must comment) (grandson, [name]) Choice offered to / list presented to : Adult Children  Admission diagnosis:  fall;genralized pain Patient Active Problem List   Diagnosis Date Noted  . Hypothyroidism 10/27/2020  . Hypertension 10/27/2020   PCP:  Pcp, No Pharmacy:   [id number] [place], [place] - [address] [place] [address] Phone: [phone] Fax: [phone]

## 2020-11-03 NOTE — ED Notes (Signed)
Pt resting, rr e/u. Call light in reach, side rails up.

## 2020-11-03 NOTE — ED Notes (Signed)
Pt resting comfortably, NADN, rr e/u.  Side rails up, call light in reach.

## 2020-11-03 NOTE — Progress Notes (Signed)
Destination  Service Provider Request Status Selected Services Address Phone Fax Patient Preferred  [facility] Preferred SNF  Accepted N/A [address] [place] [address] [phone] --  [facility] Preferred SNF  Accepted N/A [address], [place] [address] [phone] --  [facility]  Accepted N/A [address], [place] [address] [phone] --  [facility] SNF  Accepted N/A [address], [place] [address] [phone] --  [facility] Preferred SNF  Accepted N/A [address], [place] [address] [phone] --  [facility]  Accepted N/A [address] [place], [place] [address] [phone] --  [facility]  Accepted N/A [address] [place] [address] [phone] --  [facility] SNF  Accepted N/A [address] [place], [place] [address] [phone] --  [facility] SNF  Accepted N/A [address] [place] [address] [phone] --  [facility] Preferred SNF  Pending - Request Sent N/A [address] [place] [address] [phone] --  [facility] Preferred SNF  Pending - Request Sent N/A [address], [place] [address] [phone] --  [facility] Preferred SNF  Pending - Request Sent N/A [address] [place] [address] [phone] --  [facility] Preferred SNF  Pending - Request Sent N/A [address] [place] [address] [phone] --  [facility] PREFERRED SNF/ALF  Pending - Request Sent N/A [address], [place] [address] [phone] --  [facility] Preferred SNF  Pending - Request Sent N/A [address] [place] [address]  [phone] --  [facility]  Pending - Request Sent N/A [address] [place] [address] [phone] --  [facility] Preferred SNF  Declined  Cannot meet patient's needs N/A [address] [place] [address] [phone] --  [facility]  Declined  Bed not available N/A [address], [place] [address] [phone] --

## 2020-11-03 NOTE — ED Notes (Signed)
PTAR at pt bedside for transfer to SNF

## 2020-11-03 NOTE — Progress Notes (Signed)
TOC CM spoke to grandson and discussed dc to SNF, grandson states he wants to review choices and will give TOC CM a call back. DC to [facility] SNF placed on hold. Pt had multiple options for SNF rehab. Isidoro Donning RN CCM, [facility] ED TOC CM [phone]

## 2020-11-03 NOTE — ED Notes (Signed)
Patient is resting comfortably. 

## 2020-11-03 NOTE — ED Notes (Signed)
She is refusing to take medications at this time. Will attempt later.

## 2020-11-03 NOTE — ED Notes (Signed)
NADN, rr e/u.  Side rails up, call light in reach.  

## 2020-11-03 NOTE — ED Notes (Addendum)
Pt is not going to [facility], other SNFs will be discussed with grandson.

## 2020-11-03 NOTE — ED Notes (Signed)
NADN, rr e/u.  Side rails up, call light in reach.

## 2022-10-12 IMAGING — CT CT CERVICAL SPINE W/O CM
3 of 4 series · 10 of 33 positions shown, 12 images · non-contrast
Comparison: None.

CLINICAL DATA: Fall 2 days ago with headaches and neck strain,
initial encounter

EXAM:
CT HEAD WITHOUT CONTRAST
CT CERVICAL SPINE WITHOUT CONTRAST
TECHNIQUE: Multidetector CT imaging of the head and cervical spine was
performed following the standard protocol without intravenous
contrast. Multiplanar CT image reconstructions of the cervical spine
were also generated.

[Series 6: orthogonal bone · axial · 0.23mm/px · z∈[+1496,+1576]mm · 2 of 107 slices shown, 3 images]
[im 31/107  soft-tissue]
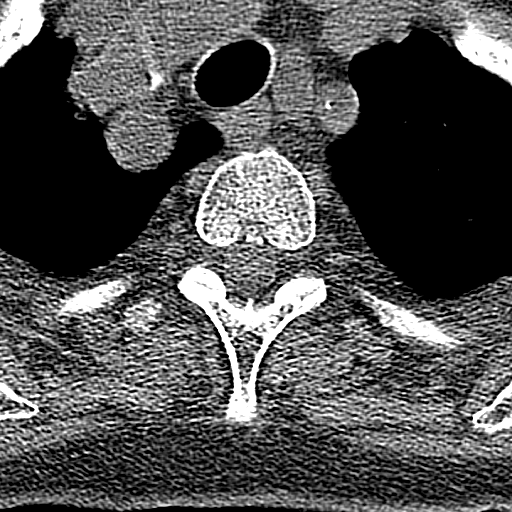
[im 31/107  bone]
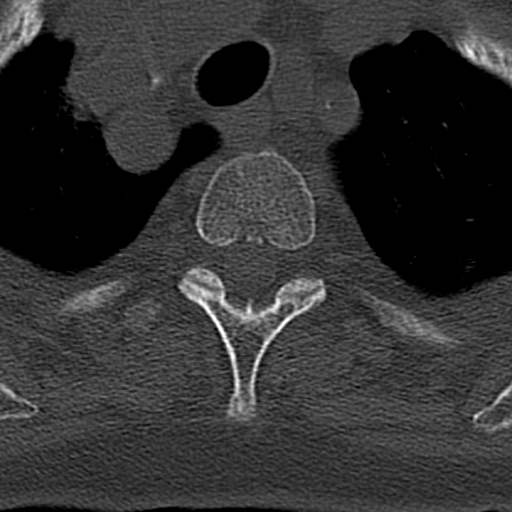
[im 76/107  bone]
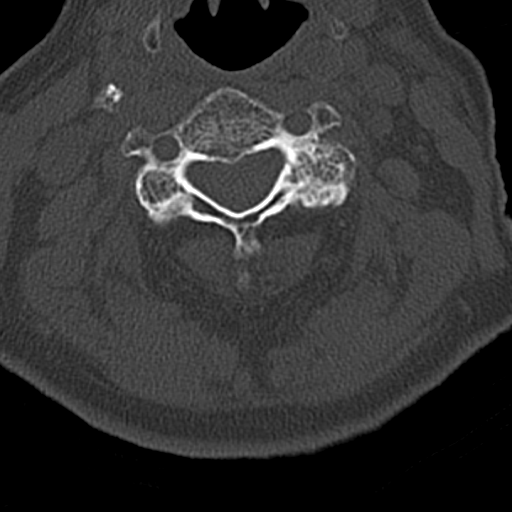

[Series 7: coronal bone · coronal · 0.27mm/px · 3 of 61 slices shown]
[im 16/61  bone]
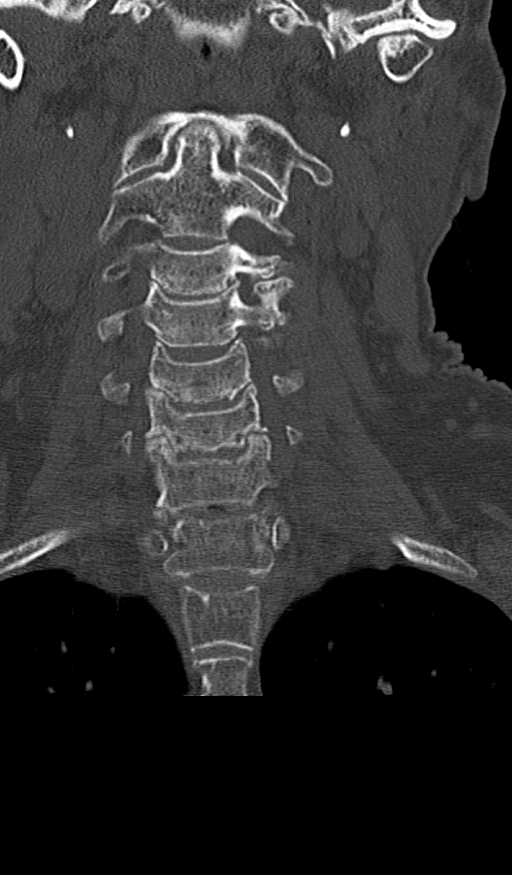
[im 26/61  bone]
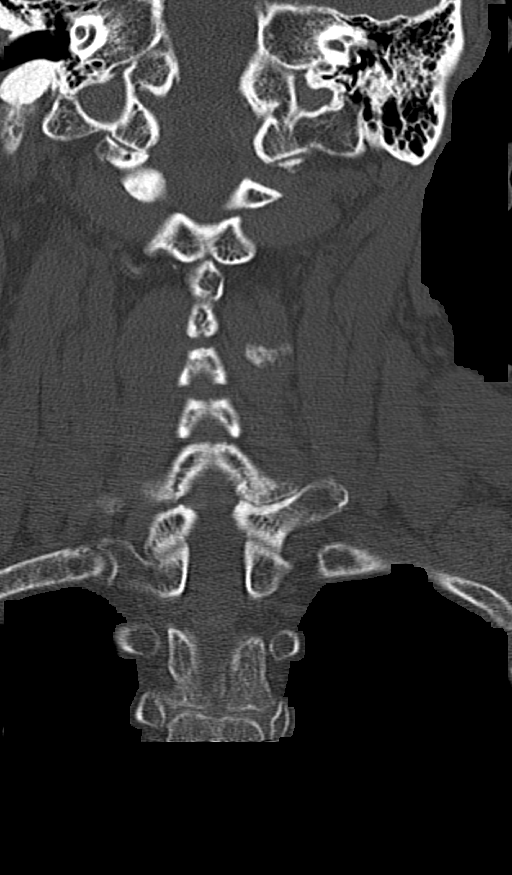
[im 35/61  bone]
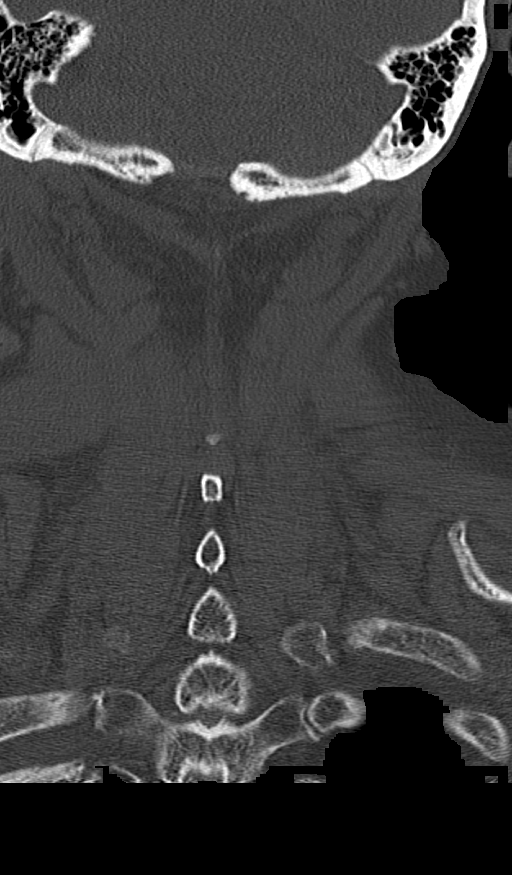

[Series 8: sagittal bone · sagittal · 0.27mm/px · 5 of 61 slices shown, 6 images]
[im 21/61  bone]
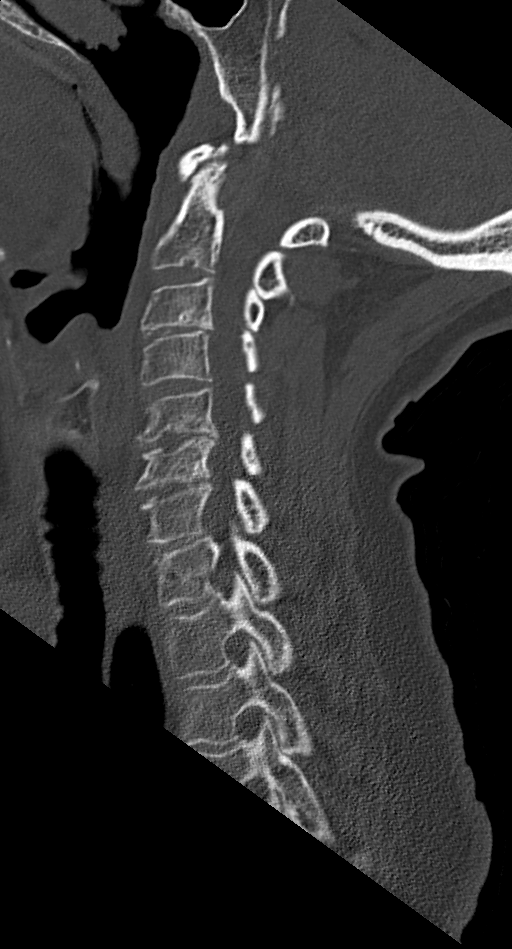
[im 26/61  bone]
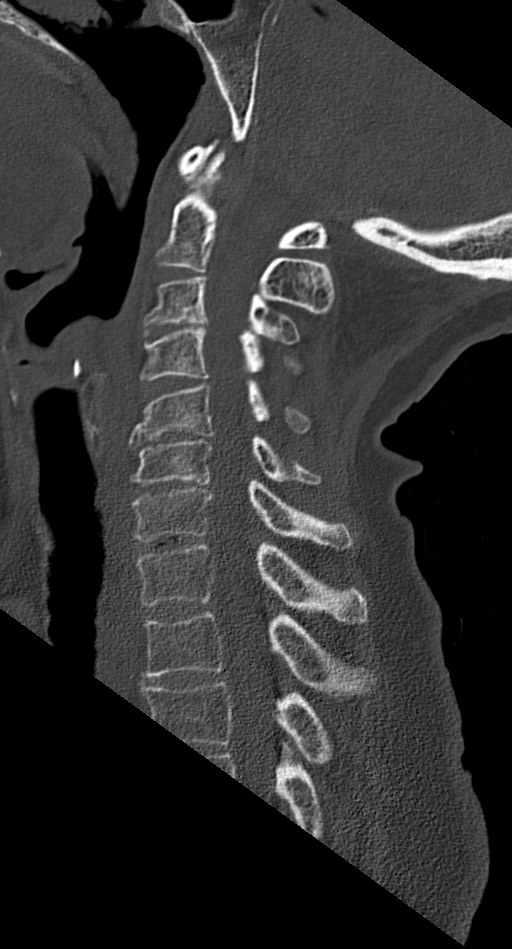
[im 31/61  soft-tissue]
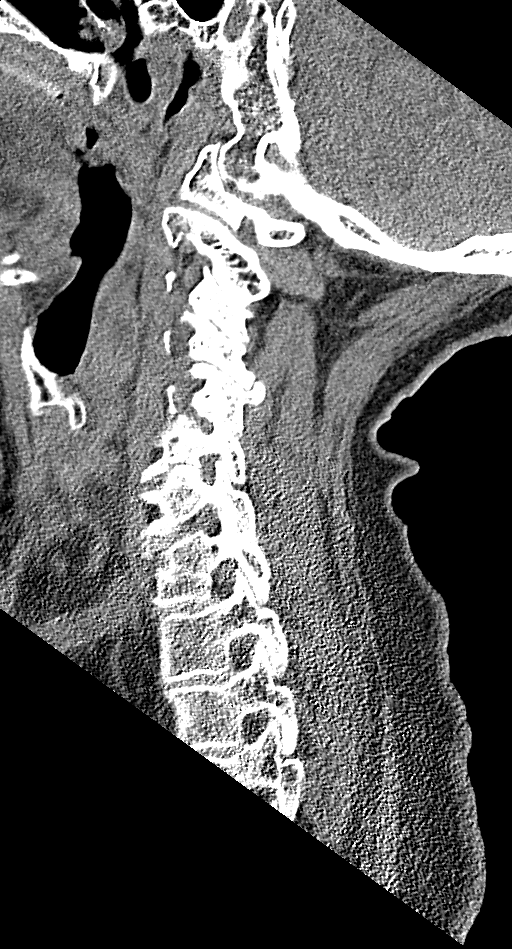
[im 31/61  bone]
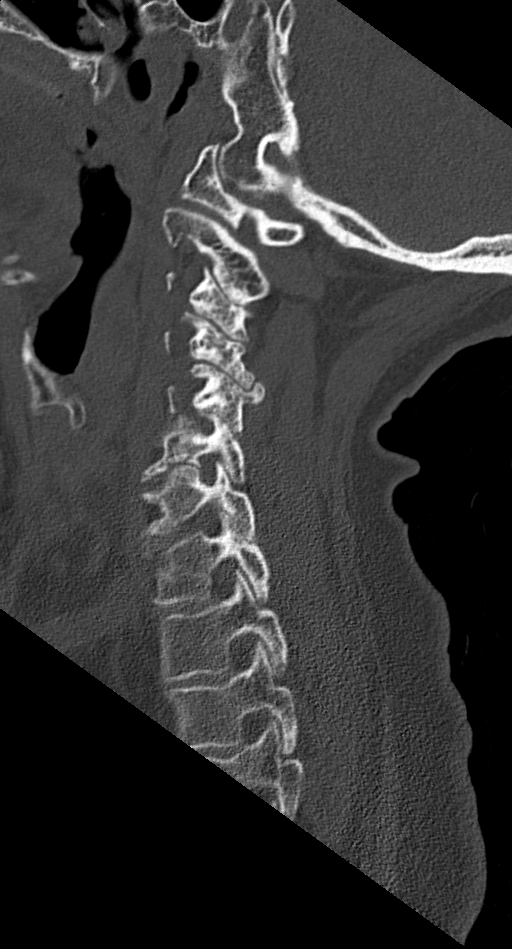
[im 36/61  bone]
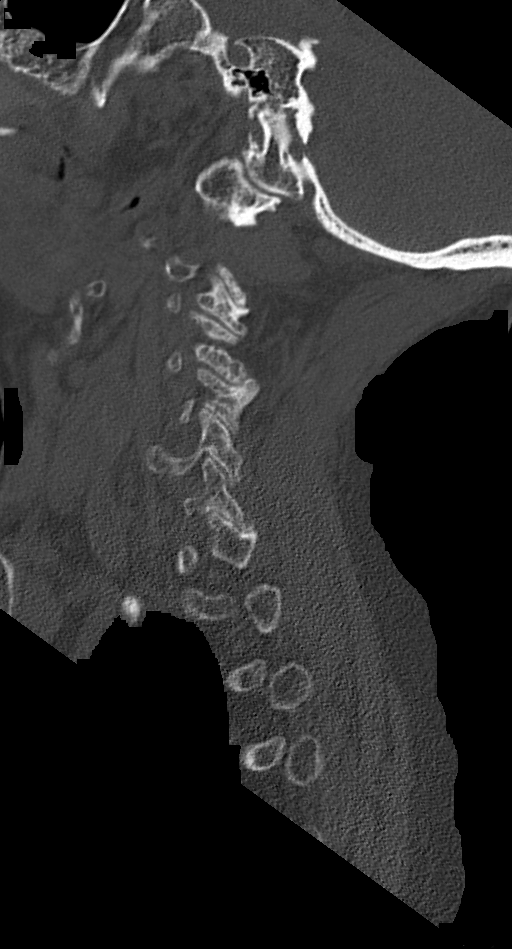
[im 41/61  bone]
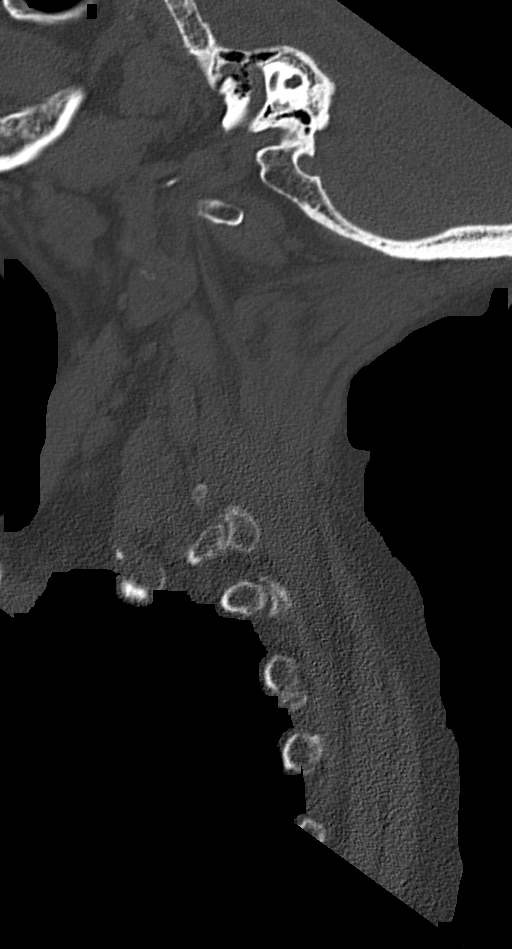

[10 of 33 positions shown; findings below may reference images not displayed]

FINDINGS: CT HEAD FINDINGS

Brain: Generalized atrophic changes and chronic white matter
ischemic changes are seen. No findings to suggest acute hemorrhage,
acute infarction or space-occupying mass lesion are noted.

Vascular: Changes consistent with aneurysm clipping on the left are
noted.

Skull: Changes consistent with prior left craniotomy are noted. No
other bony abnormality is seen.

Sinuses/Orbits: No acute finding.

Other: None.

CT CERVICAL SPINE FINDINGS

Alignment: Within normal limits.

Skull base and vertebrae: 7 cervical segments are well visualized.
Vertebral body height is well maintained. Multilevel osteophytic
changes and facet hypertrophic changes are noted. No acute fracture
or acute facet abnormality is noted.

Soft tissues and spinal canal: Surrounding soft tissue structures
are within normal limits.

Upper chest: Visualized lung apices are within normal limits.

Other: None
IMPRESSION: CT of the head: Chronic atrophic and ischemic changes without acute
abnormality.

Changes of prior aneurysm clipping on the left.

CT of the cervical spine: Multilevel degenerative changes are noted
without acute abnormality.

## 2022-10-12 IMAGING — CT CT HEAD W/O CM
3 series · 14 of 47 positions shown, 16 images · non-contrast
Comparison: None.

CLINICAL DATA: Fall 2 days ago with headaches and neck strain,
initial encounter

EXAM:
CT HEAD WITHOUT CONTRAST
CT CERVICAL SPINE WITHOUT CONTRAST
TECHNIQUE: Multidetector CT imaging of the head and cervical spine was
performed following the standard protocol without intravenous
contrast. Multiplanar CT image reconstructions of the cervical spine
were also generated.

[Series 3: head wo · axial · 0.47mm/px · z∈[+1650,+1785]mm · 8 of 33 slices shown, 10 images]
[im 3/33  brain]
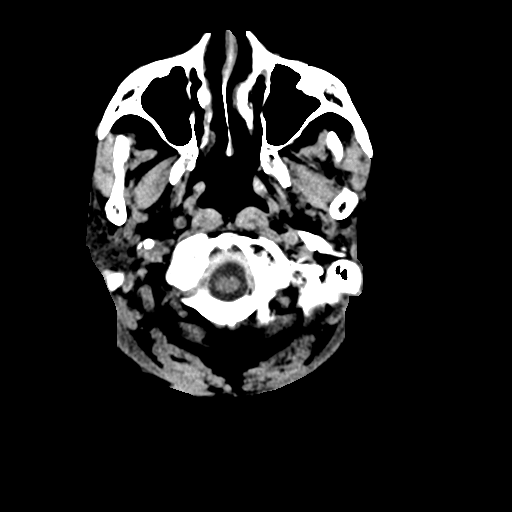
[im 3/33  bone]
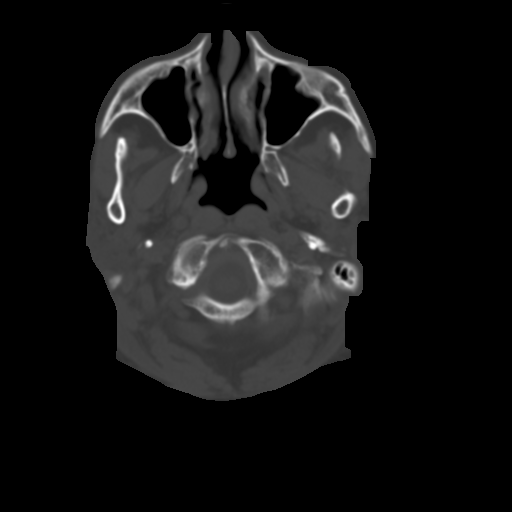
[im 7/33  brain]
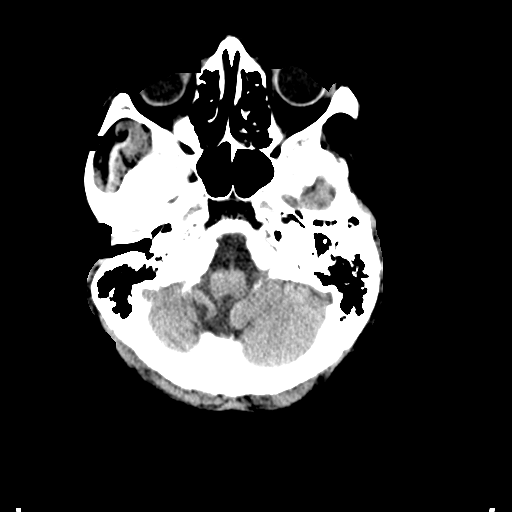
[im 10/33  brain]
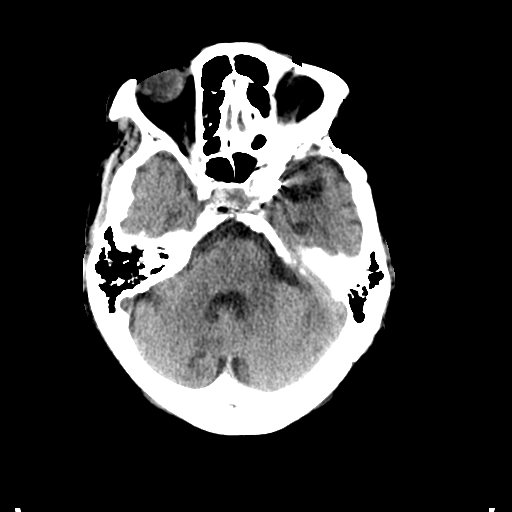
[im 15/33  brain]
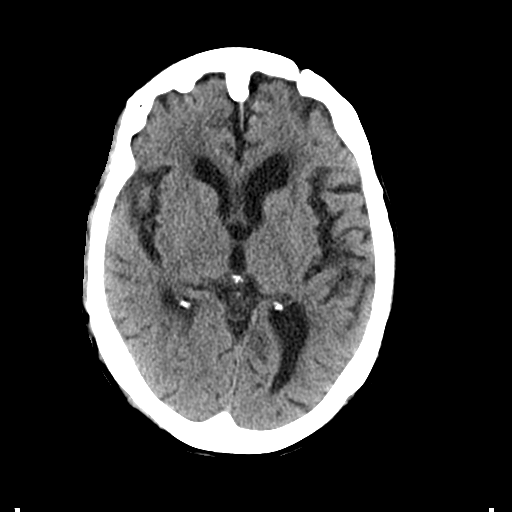
[im 18/33  brain]
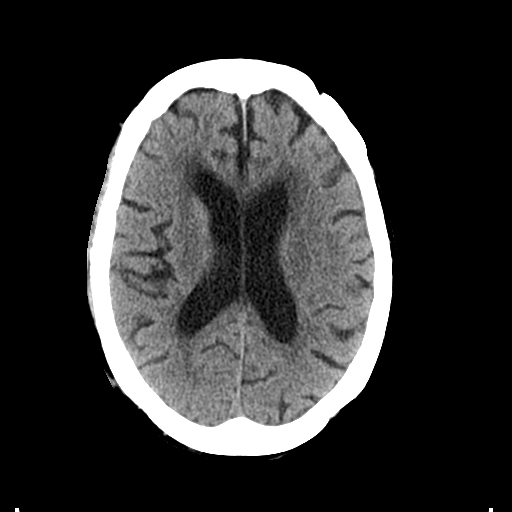
[im 18/33  bone]
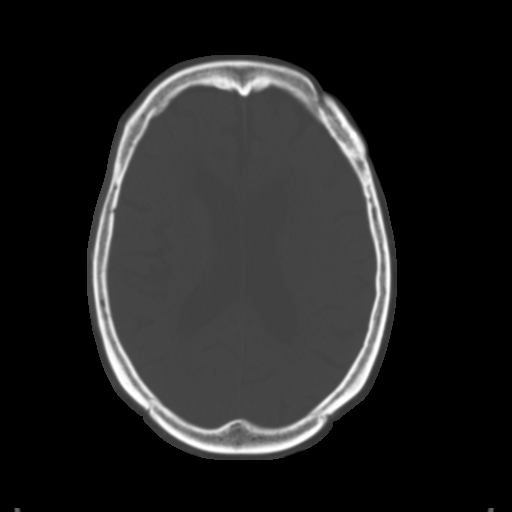
[im 23/33  brain]
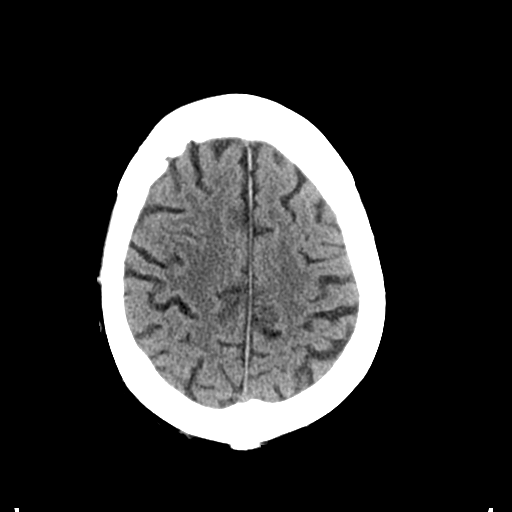
[im 26/33  brain]
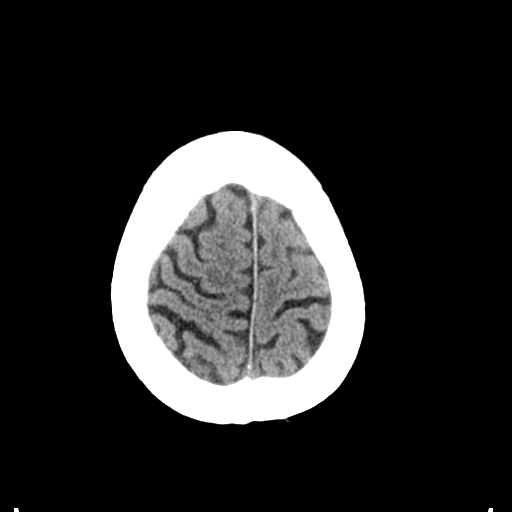
[im 30/33  brain]
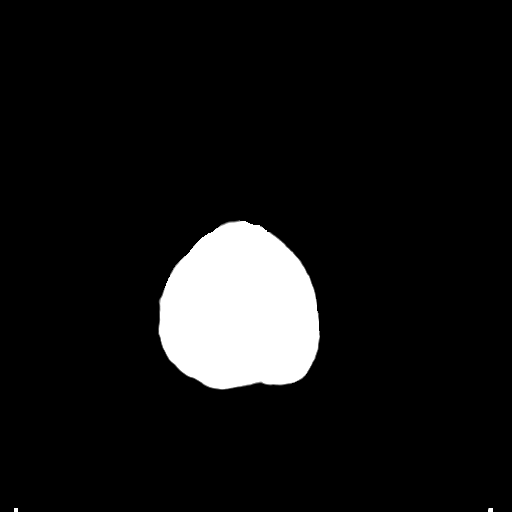

[Series 6: coronal soft tissue · coronal · 0.32mm/px · 3 of 69 slices shown]
[im 23/69  brain]
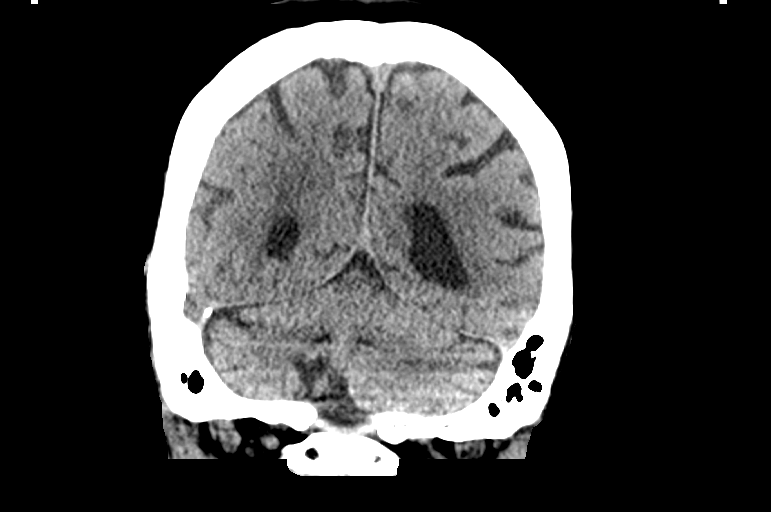
[im 31/69  brain]
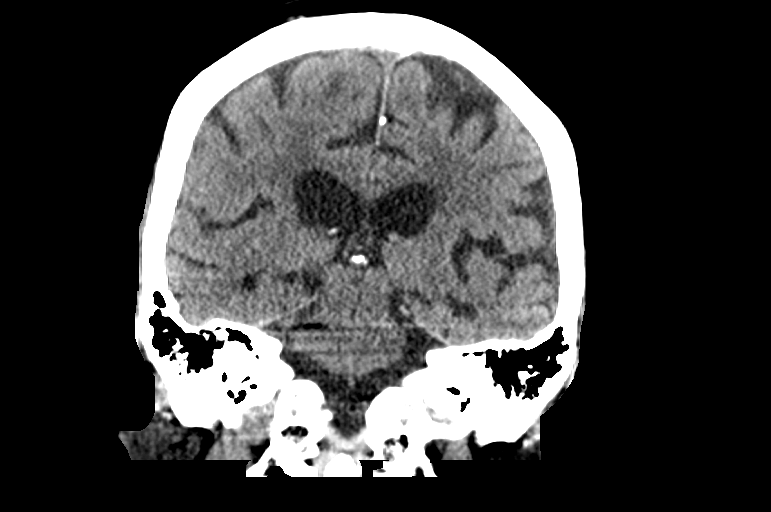
[im 38/69  brain]
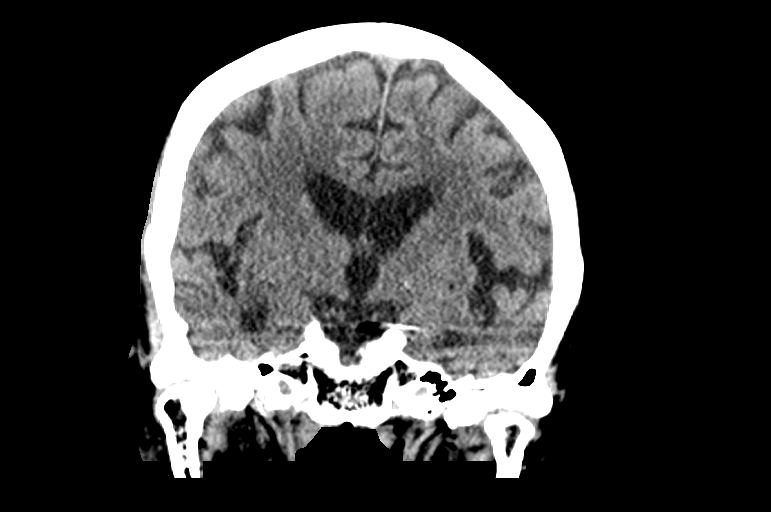

[Series 7: sagittal soft tissue · sagittal · 0.31mm/px · 3 of 55 slices shown]
[im 19/55  brain]
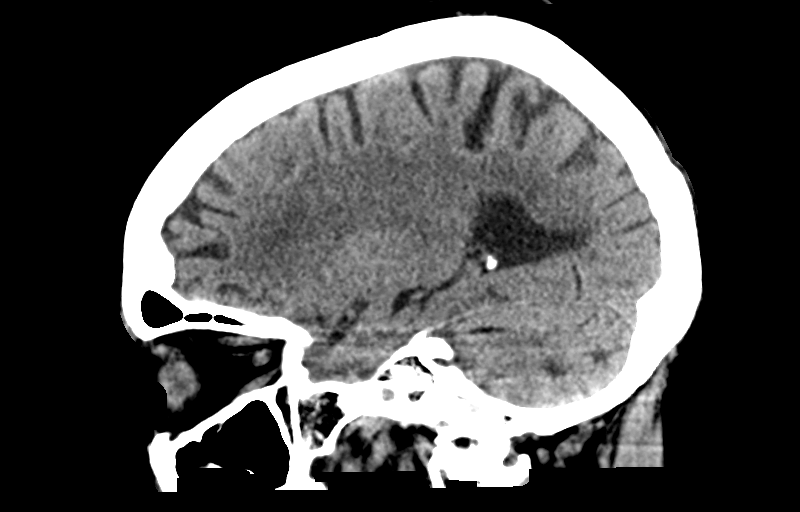
[im 28/55  brain]
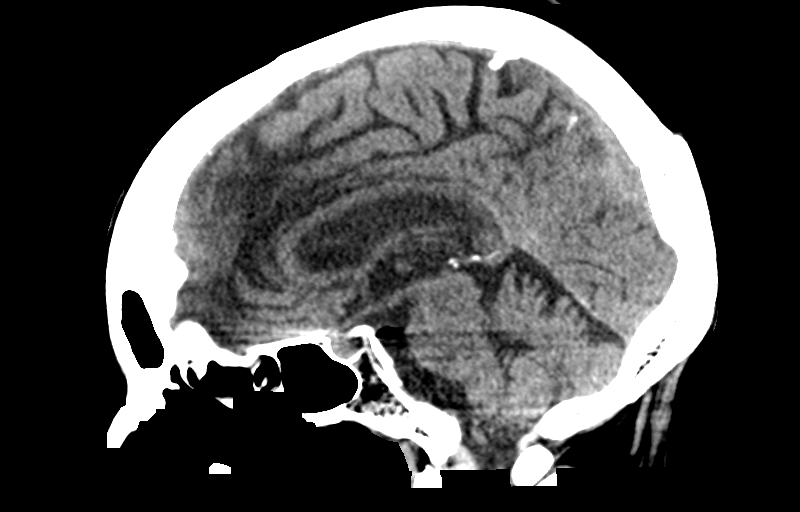
[im 37/55  brain]
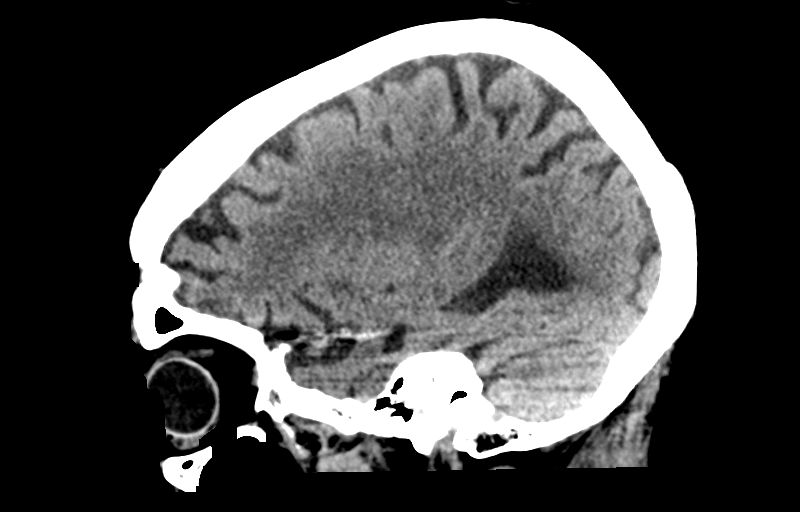

[14 of 47 positions shown; findings below may reference images not displayed]

FINDINGS: CT HEAD FINDINGS

Brain: Generalized atrophic changes and chronic white matter
ischemic changes are seen. No findings to suggest acute hemorrhage,
acute infarction or space-occupying mass lesion are noted.

Vascular: Changes consistent with aneurysm clipping on the left are
noted.

Skull: Changes consistent with prior left craniotomy are noted. No
other bony abnormality is seen.

Sinuses/Orbits: No acute finding.

Other: None.

CT CERVICAL SPINE FINDINGS

Alignment: Within normal limits.

Skull base and vertebrae: 7 cervical segments are well visualized.
Vertebral body height is well maintained. Multilevel osteophytic
changes and facet hypertrophic changes are noted. No acute fracture
or acute facet abnormality is noted.

Soft tissues and spinal canal: Surrounding soft tissue structures
are within normal limits.

Upper chest: Visualized lung apices are within normal limits.

Other: None
IMPRESSION: CT of the head: Chronic atrophic and ischemic changes without acute
abnormality.

Changes of prior aneurysm clipping on the left.

CT of the cervical spine: Multilevel degenerative changes are noted
without acute abnormality.
